# Patient Record
Sex: Male | Born: 1990 | Race: Black or African American | Hispanic: No | Marital: Single | Smoking: Never smoker
Health system: Southern US, Community
[De-identification: ages and names within clinical notes are randomized; demographics above are authoritative.]

## PROBLEM LIST (undated history)

## (undated) DIAGNOSIS — I10 Essential (primary) hypertension: Secondary | ICD-10-CM

---
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
## 2011-02-11 ENCOUNTER — Emergency Department: Payer: Self-pay | Admitting: Emergency Medicine

## 2011-12-01 ENCOUNTER — Emergency Department: Payer: Self-pay | Admitting: Emergency Medicine

## 2017-07-25 ENCOUNTER — Emergency Department
Admission: EM | Admit: 2017-07-25 | Discharge: 2017-07-25 | Disposition: A | Discharge status: Home / Self Care | Payer: Self-pay | Attending: Emergency Medicine | Admitting: Emergency Medicine

## 2017-07-25 ENCOUNTER — Encounter: Payer: Self-pay | Admitting: Emergency Medicine

## 2017-07-25 DIAGNOSIS — R1013 Epigastric pain: Secondary | ICD-10-CM

## 2017-07-25 LAB — COMPREHENSIVE METABOLIC PANEL
ALK PHOS: 69 U/L (ref 38–126)
ALT: 18 U/L (ref 17–63)
AST: 21 U/L (ref 15–41)
Albumin: 4.4 g/dL (ref 3.5–5.0)
Anion gap: 12 (ref 5–15)
BILIRUBIN TOTAL: 1 mg/dL (ref 0.3–1.2)
BUN: 11 mg/dL (ref 6–20)
CALCIUM: 9.6 mg/dL (ref 8.9–10.3)
CHLORIDE: 104 mmol/L (ref 101–111)
CO2: 23 mmol/L (ref 22–32)
CREATININE: 0.9 mg/dL (ref 0.61–1.24)
Glucose, Bld: 121 mg/dL — ABNORMAL HIGH (ref 65–99)
Potassium: 3.8 mmol/L (ref 3.5–5.1)
Sodium: 139 mmol/L (ref 135–145)
Total Protein: 8.7 g/dL — ABNORMAL HIGH (ref 6.5–8.1)

## 2017-07-25 LAB — CBC
HCT: 52.3 % — ABNORMAL HIGH (ref 40.0–52.0)
Hemoglobin: 18 g/dL (ref 13.0–18.0)
MCH: 32.8 pg (ref 26.0–34.0)
MCHC: 34.5 g/dL (ref 32.0–36.0)
MCV: 95 fL (ref 80.0–100.0)
PLATELETS: 383 10*3/uL (ref 150–440)
RBC: 5.5 MIL/uL (ref 4.40–5.90)
RDW: 13.3 % (ref 11.5–14.5)
WBC: 13.1 10*3/uL — AB (ref 3.8–10.6)

## 2017-07-25 LAB — URINALYSIS, COMPLETE (UACMP) WITH MICROSCOPIC
Bilirubin Urine: NEGATIVE
Glucose, UA: NEGATIVE mg/dL
Hgb urine dipstick: NEGATIVE
Ketones, ur: NEGATIVE mg/dL
Nitrite: NEGATIVE
Protein, ur: NEGATIVE mg/dL
Specific Gravity, Urine: 1.01 (ref 1.005–1.030)
pH: 7 (ref 5.0–8.0)

## 2017-07-25 LAB — LIPASE, BLOOD: Lipase: 26 U/L (ref 11–51)

## 2017-07-25 MED ORDER — SUCRALFATE 1 G PO TABS: 1.0000 g | ORAL_TABLET | Freq: Four times a day (QID) | ORAL | 1 refills | Status: AC | PRN

## 2017-07-25 NOTE — ED Provider Notes (Signed)
[facility] Emergency Department Provider Note  ____________________________________________   First MD Initiated Contact with Patient 07/25/17 2118     (approximate)  I have reviewed the triage vital signs and the nursing notes.   HISTORY  Chief Complaint Abdominal Pain    HPI Mike Peterson is a 26 y.o. male who is generally healthy with no chronic medical problems who presents for evaluation of several days of persistent but intermittent sharp and burning epigastric abdominal pain.  Initially told triage that it was on the right side but he indicates to me it is in the epigastrium just below his rib cage.  He states that he does drink quite a bit of alcohol regularly, not to the point of vomiting, but "enough".  his pain started last weekend when he was drinking more.  He states he has not had any for a couple of days but those symptoms have persisted.  Nothing in particular makes the patient's symptoms better nor worse, including eating spicy or greasy food.  He has not had any vomiting and had a little bit of nausea several days ago but that has improved.  The patient denies fever/chills, chest pain, shortness of breath, and dysuria. he describes the pain as moderate at times.  Right now he says it is gone.   History reviewed. No pertinent past medical history.  There are no active problems to display for this patient.   History reviewed. No pertinent surgical history.  Prior to Admission medications   Medication Sig Start Date End Date Taking? Authorizing Provider  sucralfate (CARAFATE) 1 g tablet Take 1 tablet (1 g total) by mouth 4 (four) times daily as needed (for abdominal discomfort, nausea, and/or vomiting). 07/25/17   Loleta Rose, MD    Allergies Patient has no known allergies.  No family history on file.  Social History Social History  Substance Use Topics  . Smoking status: Never Smoker  . Smokeless tobacco: Never Used  . Alcohol use  Yes    Review of Systems Constitutional: No fever/chills Eyes: No visual changes. ENT: No sore throat. Cardiovascular: Denies chest pain. Respiratory: Denies shortness of breath. Gastrointestinal:  Genitourinary: Negative for dysuria. Musculoskeletal: Negative for neck pain.  Negative for back pain. Integumentary: Negative for rash. Neurological: Negative for headaches, focal weakness or numbness.   ____________________________________________   PHYSICAL EXAM:  VITAL SIGNS: ED Triage Vitals  Enc Vitals Group     BP 07/25/17 1950 (!) 165/104     Pulse Rate 07/25/17 1950 72     Resp 07/25/17 1950 18     Temp 07/25/17 1950 98.7 F (37.1 C)     Temp Source 07/25/17 1950 Oral     SpO2 07/25/17 1950 97 %     Weight 07/25/17 1949 77.1 kg (170 lb)     Height 07/25/17 1949 1.727 m ( )     Head Circumference --      Peak Flow --      Pain Score --      Pain Loc --      Pain Edu? --      Excl. in GC? --     Constitutional: Alert and oriented. Well appearing and in no acute distress. Eyes: Conjunctivae are normal.  Head: Atraumatic. Cardiovascular: Normal rate, regular rhythm. Good peripheral circulation. Grossly normal heart sounds. Respiratory: Normal respiratory effort.  No retractions. Lungs CTAB. Gastrointestinal: Soft with minimal tenderness to palpation of the epigastrium.  No rebound/guarding, no RUQ tenderness, negative  murphy's sign, no tenderness at McBurney's Musculoskeletal: No lower extremity tenderness nor edema. No gross deformities of extremities. Neurologic:  Normal speech and language. No gross focal neurologic deficits are appreciated.  Skin:  Skin is warm, dry and intact. No rash noted. Psychiatric: Mood and affect are normal. Speech and behavior are normal.  ____________________________________________   LABS (all labs ordered are listed, but only abnormal results are displayed)  Labs Reviewed  COMPREHENSIVE METABOLIC PANEL - Abnormal; Notable  for the following:       Result Value   Glucose, Bld 121 (*)    Total Protein 8.7 (*)    All other components within normal limits  CBC - Abnormal; Notable for the following:    WBC 13.1 (*)    HCT 52.3 (*)    All other components within normal limits  URINALYSIS, COMPLETE (UACMP) WITH MICROSCOPIC - Abnormal; Notable for the following:    Leukocytes, UA TRACE (*)    Squamous Epithelial / LPF 0-5 (*)    Bacteria, UA RARE (*)    All other components within normal limits  LIPASE, BLOOD   ____________________________________________  EKG  None - EKG not ordered by ED physician ____________________________________________  RADIOLOGY   No results found.  ____________________________________________   PROCEDURES  Critical Care performed: No   Procedure(s) performed:   Procedures   ____________________________________________   INITIAL IMPRESSION / ASSESSMENT AND PLAN / ED COURSE  Pertinent labs & imaging results that were available during my care of the patient were reviewed by me and considered in my medical decision making (see chart for details).  The patient is well-appearing and in no acute distress.  His blood pressure is elevated but this is likely a chronic issue and he has no symptoms as a result of it.  His lab work is unremarkable.  I discussed my recommendation from sticking with a bland diet, avoiding alcohol, and trying some Carafate for the next few days.  I gave him outpatient follow-up information.  I gave my usual and customary return precautions.  He understands and agrees with the plan.  There is no indication for any imaging at this time.      ____________________________________________  FINAL CLINICAL IMPRESSION(S) / ED DIAGNOSES  Final diagnoses:  Epigastric pain     MEDICATIONS GIVEN DURING THIS VISIT:  Medications - No data to display   NEW OUTPATIENT MEDICATIONS STARTED DURING THIS VISIT:  Discharge Medication List as of  07/25/2017  9:26 PM    START taking these medications   Details  sucralfate (CARAFATE) 1 g tablet Take 1 tablet (1 g total) by mouth 4 (four) times daily as needed (for abdominal discomfort, nausea, and/or vomiting)., Starting Wed 07/25/2017, Print        Discharge Medication List as of 07/25/2017  9:26 PM      Discharge Medication List as of 07/25/2017  9:26 PM       Note:  This document was prepared using Dragon voice recognition software and may include unintentional dictation errors.    Loleta RoseForbach, Trentyn Boisclair, MD 07/25/17 2300

## 2017-07-25 NOTE — ED Triage Notes (Addendum)
Patient ambulatory to triage with steady gait, without difficulty or distress noted; pt reports mid right sided abd pain several days with no accomp symptoms; denies hx of same; pt denies any pain at present, st intermittent

## 2017-07-25 NOTE — ED Notes (Signed)
Pt states middle upper abd pain x few days. Denies N&V&D&fever. Denies worse after eating. States pain comes and goes. Still has all belly organs. Alert, oriented, ambulatory.

## 2017-07-25 NOTE — Discharge Instructions (Signed)
You have been seen in the Emergency Department (ED) for abdominal pain.  Your evaluation did not identify a clear cause of your symptoms but was generally reassuring.  Please read through the information about bland diets, and avoid alcohol for at least the next couple of days.  Please follow up as instructed above regarding today?s emergent visit and the symptoms that are bothering you.  Return to the ED if your abdominal pain worsens or fails to improve, you develop bloody vomiting, bloody diarrhea, you are unable to tolerate fluids due to vomiting, fever greater than 101, or other symptoms that concern you.

## 2018-08-01 ENCOUNTER — Other Ambulatory Visit: Payer: Self-pay | Admitting: Podiatry

## 2018-08-01 ENCOUNTER — Encounter
Admission: RE | Admit: 2018-08-01 | Discharge: 2018-08-01 | Disposition: A | Discharge status: Home / Self Care | Payer: Self-pay | Source: Ambulatory Visit | Attending: Podiatry | Admitting: Podiatry

## 2018-08-01 HISTORY — DX: Essential (primary) hypertension: I10

## 2018-08-01 MED ORDER — CEFAZOLIN SODIUM-DEXTROSE 2-4 GM/100ML-% IV SOLN: 2.0000 g | INTRAVENOUS | Status: DC

## 2018-08-01 NOTE — Patient Instructions (Signed)
Your procedure is scheduled on:08/02/18 0730 AM Report to Day Surgery. MEDICAL MALL SECOND FLOOR WILL ENTER ED ENTRANCE   Remember: Instructions that are not followed completely may result in serious medical risk,  up to and including death, or upon the discretion of your surgeon and anesthesiologist your  surgery may need to be rescheduled.     _X__ 1. Do not eat food after midnight the night before your procedure.                 No gum chewing or hard candies. You may drink clear liquids up to 2 hours                 before you are scheduled to arrive for your surgery- DO not drink clear                 liquids within 2 hours of the start of your surgery.                 Clear Liquids include:  water, apple juice without pulp, clear carbohydrate                 drink such as Clearfast of Gatorade, Black Coffee or Tea (Do not add                 anything to coffee or tea).  __X__2.  On the morning of surgery brush your teeth with toothpaste and water, you                may rinse your mouth with mouthwash if you wish.  Do not swallow any toothpaste of mouthwash.     _X__ 3.  No Alcohol for 24 hours before or after surgery.   _X__ 4.  Do Not Smoke or use e-cigarettes For 24 Hours Prior to Your Surgery.                 Do not use any chewable tobacco products for at least 6 hours prior to                 surgery.  ____  5.  Bring all medications with you on the day of surgery if instructed.   X____  6.  Notify your doctor if there is any change in your medical condition      (cold, fever, infections).     Do not wear jewelry, make-up, hairpins, clips or nail polish. Do not wear lotions, powders, or perfumes. You may wear deodorant. Do not shave 48 hours prior to surgery. Men may shave face and neck. Do not bring valuables to the hospital.    [facility] any belongings or valuables.  Contacts, dentures or bridgework may not be worn into  surgery. Leave your suitcase in the car. After surgery it may be brought to your room. For patients admitted to the hospital, discharge time is determined by your treatment team.   Patients discharged the day of surgery will not be allowed to drive home.     ____ Take these medicines the morning of surgery with A SIP OF WATER:    1NONE  2.   3.   4.  5.  6.  ____ Fleet Enema (as directed)   ____ Use CHG Soap as directed  ____ Use inhalers on the day of surgery  ____ Stop metformin 2 days prior to surgery    ____ Take 1/2 of usual insulin dose the  night before surgery. No insulin the morning          of surgery.   ____ Stop Coumadin/Plavix/aspirin on  _X__ Stop Anti-inflammatories on      NO MORE IBUPROFEN UNTIL AFTER SURGERY  ____ Stop supplements until after surgery.    ____ Bring C-Pap to the hospital.   INTERVIEW AND INSTRUCTIONS GIVEN TO SUSAN

## 2018-08-02 ENCOUNTER — Ambulatory Visit: Admitting: Anesthesiology

## 2018-08-02 ENCOUNTER — Ambulatory Visit
Admission: RE | Admit: 2018-08-02 | Discharge: 2018-08-02 | Disposition: A | Discharge status: Home / Self Care | Source: Ambulatory Visit | Attending: Podiatry | Admitting: Podiatry

## 2018-08-02 ENCOUNTER — Other Ambulatory Visit: Payer: Self-pay

## 2018-08-02 ENCOUNTER — Encounter
Admission: RE | Disposition: A | Discharge status: Home / Self Care | Payer: Self-pay | Source: Ambulatory Visit | Attending: Podiatry

## 2018-08-02 ENCOUNTER — Encounter: Payer: Self-pay | Admitting: *Deleted

## 2018-08-02 ENCOUNTER — Ambulatory Visit

## 2018-08-02 DIAGNOSIS — Z419 Encounter for procedure for purposes other than remedying health state, unspecified: Secondary | ICD-10-CM

## 2018-08-02 DIAGNOSIS — F17290 Nicotine dependence, other tobacco product, uncomplicated: Secondary | ICD-10-CM | POA: Insufficient documentation

## 2018-08-02 DIAGNOSIS — S82831A Other fracture of upper and lower end of right fibula, initial encounter for closed fracture: Secondary | ICD-10-CM | POA: Insufficient documentation

## 2018-08-02 DIAGNOSIS — I10 Essential (primary) hypertension: Secondary | ICD-10-CM | POA: Diagnosis not present

## 2018-08-02 DIAGNOSIS — X58XXXA Exposure to other specified factors, initial encounter: Secondary | ICD-10-CM | POA: Insufficient documentation

## 2018-08-02 HISTORY — PX: ORIF ANKLE FRACTURE: SHX5408

## 2018-08-02 LAB — POCT I-STAT 4, (NA,K, GLUC, HGB,HCT)
Glucose, Bld: 99 mg/dL (ref 70–99)
HCT: 49 % (ref 39.0–52.0)
Hemoglobin: 16.7 g/dL (ref 13.0–17.0)
Potassium: 3.9 mmol/L (ref 3.5–5.1)
Sodium: 139 mmol/L (ref 135–145)

## 2018-08-02 SURGERY — OPEN REDUCTION INTERNAL FIXATION (ORIF) ANKLE FRACTURE
Anesthesia: General | Site: Ankle | Laterality: Right

## 2018-08-02 MED ORDER — FAMOTIDINE 20 MG PO TABS: 20.0000 mg | ORAL_TABLET | Freq: Once | ORAL | Status: DC

## 2018-08-02 MED ORDER — POVIDONE-IODINE 7.5 % EX SOLN
Freq: Once | CUTANEOUS | Status: DC
  Filled 2018-08-02: qty 118

## 2018-08-02 MED ORDER — HYDROMORPHONE HCL 1 MG/ML IJ SOLN
INTRAMUSCULAR | Status: AC
  Filled 2018-08-02: qty 1

## 2018-08-02 MED ORDER — PROPOFOL 10 MG/ML IV BOLUS
INTRAVENOUS | Status: AC
  Filled 2018-08-02: qty 20

## 2018-08-02 MED ORDER — ONDANSETRON HCL 4 MG PO TABS: 4.0000 mg | ORAL_TABLET | Freq: Four times a day (QID) | ORAL | Status: DC | PRN

## 2018-08-02 MED ORDER — DEXMEDETOMIDINE HCL 200 MCG/2ML IV SOLN
INTRAVENOUS | Status: DC | PRN
  Administered 2018-08-02: 12 ug via INTRAVENOUS

## 2018-08-02 MED ORDER — MIDAZOLAM HCL 2 MG/2ML IJ SOLN
INTRAMUSCULAR | Status: DC | PRN
  Administered 2018-08-02: 2 mg via INTRAVENOUS

## 2018-08-02 MED ORDER — LIDOCAINE HCL (CARDIAC) PF 100 MG/5ML IV SOSY
PREFILLED_SYRINGE | INTRAVENOUS | Status: DC | PRN
  Administered 2018-08-02: 70 mg via INTRAVENOUS

## 2018-08-02 MED ORDER — MIDAZOLAM HCL 2 MG/2ML IJ SOLN
1.0000 mg | Freq: Once | INTRAMUSCULAR | Status: AC
  Administered 2018-08-02: 1 mg via INTRAVENOUS

## 2018-08-02 MED ORDER — ROPIVACAINE HCL 5 MG/ML IJ SOLN
INTRAMUSCULAR | Status: AC
  Filled 2018-08-02: qty 30

## 2018-08-02 MED ORDER — LIDOCAINE HCL (PF) 1 % IJ SOLN
INTRAMUSCULAR | Status: AC
  Filled 2018-08-02: qty 5

## 2018-08-02 MED ORDER — CEFAZOLIN SODIUM-DEXTROSE 2-4 GM/100ML-% IV SOLN
INTRAVENOUS | Status: AC
  Filled 2018-08-02: qty 100

## 2018-08-02 MED ORDER — DEXAMETHASONE SODIUM PHOSPHATE 10 MG/ML IJ SOLN
INTRAMUSCULAR | Status: DC | PRN
  Administered 2018-08-02: 5 mg via INTRAVENOUS

## 2018-08-02 MED ORDER — FENTANYL CITRATE (PF) 100 MCG/2ML IJ SOLN
INTRAMUSCULAR | Status: AC
  Filled 2018-08-02: qty 2

## 2018-08-02 MED ORDER — ROPIVACAINE HCL 5 MG/ML IJ SOLN
INTRAMUSCULAR | Status: DC | PRN
  Administered 2018-08-02: 20 mL via PERINEURAL
  Administered 2018-08-02: 10 mL via PERINEURAL

## 2018-08-02 MED ORDER — PROPOFOL 10 MG/ML IV BOLUS
INTRAVENOUS | Status: DC | PRN
  Administered 2018-08-02: 200 mg via INTRAVENOUS

## 2018-08-02 MED ORDER — FAMOTIDINE 20 MG PO TABS
ORAL_TABLET | ORAL | Status: AC
  Filled 2018-08-02: qty 1

## 2018-08-02 MED ORDER — ONDANSETRON HCL 4 MG/2ML IJ SOLN
INTRAMUSCULAR | Status: AC
  Filled 2018-08-02: qty 2

## 2018-08-02 MED ORDER — LIDOCAINE HCL (PF) 1 % IJ SOLN
INTRAMUSCULAR | Status: DC | PRN
  Administered 2018-08-02: 1 mL via INTRADERMAL

## 2018-08-02 MED ORDER — LACTATED RINGERS IV SOLN
INTRAVENOUS | Status: DC | PRN
  Administered 2018-08-02: 10:00:00 via INTRAVENOUS

## 2018-08-02 MED ORDER — MIDAZOLAM HCL 2 MG/2ML IJ SOLN
INTRAMUSCULAR | Status: AC
  Administered 2018-08-02: 1 mg via INTRAVENOUS
  Filled 2018-08-02: qty 2

## 2018-08-02 MED ORDER — NEOMYCIN-POLYMYXIN B GU 40-200000 IR SOLN
Status: DC | PRN
  Administered 2018-08-02: 2 mL

## 2018-08-02 MED ORDER — ACETAMINOPHEN 10 MG/ML IV SOLN
INTRAVENOUS | Status: DC | PRN
  Administered 2018-08-02: 1000 mg via INTRAVENOUS

## 2018-08-02 MED ORDER — IBUPROFEN 800 MG PO TABS: 800.0000 mg | ORAL_TABLET | Freq: Three times a day (TID) | ORAL | 0 refills | Status: AC | PRN

## 2018-08-02 MED ORDER — ACETAMINOPHEN 10 MG/ML IV SOLN
INTRAVENOUS | Status: AC
  Filled 2018-08-02: qty 100

## 2018-08-02 MED ORDER — FENTANYL CITRATE (PF) 100 MCG/2ML IJ SOLN: INTRAMUSCULAR | Status: DC | PRN

## 2018-08-02 MED ORDER — HYDROMORPHONE HCL 1 MG/ML IJ SOLN
INTRAMUSCULAR | Status: DC | PRN
  Administered 2018-08-02: 0.5 mg via INTRAVENOUS

## 2018-08-02 MED ORDER — OXYCODONE HCL 5 MG/5ML PO SOLN: 5.0000 mg | Freq: Once | ORAL | Status: DC | PRN

## 2018-08-02 MED ORDER — OXYCODONE HCL 5 MG PO TABS: 5.0000 mg | ORAL_TABLET | Freq: Once | ORAL | Status: DC | PRN

## 2018-08-02 MED ORDER — MIDAZOLAM HCL 2 MG/2ML IJ SOLN
INTRAMUSCULAR | Status: AC
  Filled 2018-08-02: qty 2

## 2018-08-02 MED ORDER — FENTANYL CITRATE (PF) 100 MCG/2ML IJ SOLN: 25.0000 ug | INTRAMUSCULAR | Status: DC | PRN

## 2018-08-02 MED ORDER — FENTANYL CITRATE (PF) 100 MCG/2ML IJ SOLN
INTRAMUSCULAR | Status: AC
  Administered 2018-08-02: 50 ug via INTRAVENOUS
  Filled 2018-08-02: qty 2

## 2018-08-02 MED ORDER — ONDANSETRON HCL 4 MG/2ML IJ SOLN
INTRAMUSCULAR | Status: DC | PRN
  Administered 2018-08-02: 4 mg via INTRAVENOUS

## 2018-08-02 MED ORDER — FENTANYL CITRATE (PF) 100 MCG/2ML IJ SOLN
50.0000 ug | Freq: Once | INTRAMUSCULAR | Status: AC
  Administered 2018-08-02: 50 ug via INTRAVENOUS

## 2018-08-02 MED ORDER — ONDANSETRON HCL 4 MG/2ML IJ SOLN: 4.0000 mg | Freq: Four times a day (QID) | INTRAMUSCULAR | Status: DC | PRN

## 2018-08-02 MED ORDER — BUPIVACAINE HCL (PF) 0.5 % IJ SOLN
INTRAMUSCULAR | Status: DC | PRN
  Administered 2018-08-02: 10 mL

## 2018-08-02 MED ORDER — LACTATED RINGERS IV SOLN
INTRAVENOUS | Status: DC
  Administered 2018-08-02: 08:00:00 via INTRAVENOUS

## 2018-08-02 MED ORDER — ACETAMINOPHEN-CODEINE 300-30 MG PO TABS: 1.0000 | ORAL_TABLET | Freq: Four times a day (QID) | ORAL | 0 refills | Status: AC | PRN

## 2018-08-02 SURGICAL SUPPLY — 46 items
BANDAGE ELASTIC 4 LF NS (GAUZE/BANDAGES/DRESSINGS) ×2 IMPLANT
BIT DRILL 2.5X2.75 QC CALB (BIT) ×2 IMPLANT
BLADE SURG 15 STRL LF DISP TIS (BLADE) IMPLANT
BLADE SURG 15 STRL SS (BLADE)
BNDG COHESIVE 4X5 TAN STRL (GAUZE/BANDAGES/DRESSINGS) ×2 IMPLANT
BNDG CONFORM 3 STRL LF (GAUZE/BANDAGES/DRESSINGS) ×2 IMPLANT
BNDG ESMARK 4X12 TAN STRL LF (GAUZE/BANDAGES/DRESSINGS) ×2 IMPLANT
BNDG GAUZE 4.5X4.1 6PLY STRL (MISCELLANEOUS) ×2 IMPLANT
CANISTER SUCT 1200ML W/VALVE (MISCELLANEOUS) ×2 IMPLANT
CUFF TOURN 30 N/S (MISCELLANEOUS) ×2 IMPLANT
DRAPE C-ARM XRAY 36X54 (DRAPES) ×2 IMPLANT
DRAPE C-ARMOR (DRAPES) ×2 IMPLANT
DURAPREP 26ML APPLICATOR (WOUND CARE) ×2 IMPLANT
ELECT REM PT RETURN 9FT ADLT (ELECTROSURGICAL) ×2
ELECTRODE REM PT RTRN 9FT ADLT (ELECTROSURGICAL) ×1 IMPLANT
GAUZE PETRO XEROFOAM 1X8 (MISCELLANEOUS) ×2 IMPLANT
GAUZE SPONGE 4X4 12PLY STRL (GAUZE/BANDAGES/DRESSINGS) ×2 IMPLANT
GAUZE STRETCH 2X75IN STRL (MISCELLANEOUS) ×2 IMPLANT
GLOVE BIO SURGEON STRL SZ7.5 (GLOVE) ×2 IMPLANT
GLOVE INDICATOR 8.0 STRL GRN (GLOVE) ×2 IMPLANT
GOWN STRL REUS W/ TWL LRG LVL3 (GOWN DISPOSABLE) ×3 IMPLANT
GOWN STRL REUS W/TWL LRG LVL3 (GOWN DISPOSABLE) ×3
KIT TURNOVER KIT A (KITS) ×2 IMPLANT
LABEL OR SOLS (LABEL) ×2 IMPLANT
NS IRRIG 500ML POUR BTL (IV SOLUTION) ×2 IMPLANT
PACK EXTREMITY ARMC (MISCELLANEOUS) ×2 IMPLANT
PAD PREP 24X41 OB/GYN DISP (PERSONAL CARE ITEMS) ×2 IMPLANT
PLATE LOCK 8H 103 BILAT FIB (Plate) ×2 IMPLANT
SCREW LOCK CORT STAR 3.5X10 (Screw) ×2 IMPLANT
SCREW LOCK CORT STAR 3.5X12 (Screw) ×8 IMPLANT
SCREW LOW PROFILE 18MMX3.5MM (Screw) ×2 IMPLANT
SCREW NON LOCKING LP 3.5 16MM (Screw) ×2 IMPLANT
SPLINT CAST 1 STEP 4X30 (MISCELLANEOUS) ×2 IMPLANT
SPLINT FAST PLASTER 5X30 (CAST SUPPLIES) ×10
SPLINT PLASTER CAST FAST 5X30 (CAST SUPPLIES) ×10 IMPLANT
SPONGE LAP 18X18 RF (DISPOSABLE) ×2 IMPLANT
STAPLER SKIN PROX 35W (STAPLE) ×2 IMPLANT
STOCKINETTE M/LG 89821 (MISCELLANEOUS) ×2 IMPLANT
STRAP SAFETY 5IN WIDE (MISCELLANEOUS) ×2 IMPLANT
STRIP CLOSURE SKIN 1/2X4 (GAUZE/BANDAGES/DRESSINGS) IMPLANT
SUT VIC AB 2-0 CT1 27 (SUTURE) ×1
SUT VIC AB 2-0 CT1 TAPERPNT 27 (SUTURE) ×1 IMPLANT
SUT VIC AB 3-0 SH 27 (SUTURE) ×1
SUT VIC AB 3-0 SH 27X BRD (SUTURE) ×1 IMPLANT
SWABSTK COMLB BENZOIN TINCTURE (MISCELLANEOUS) IMPLANT
SYR 10ML LL (SYRINGE) ×2 IMPLANT

## 2018-08-02 NOTE — Anesthesia Post-op Follow-up Note (Signed)
Anesthesia QCDR form completed.        

## 2018-08-02 NOTE — Discharge Instructions (Signed)
[facility] °[facility] ° °POST OPERATIVE INSTRUCTIONS FOR DR. TROXLER AND DR. FOWLER °[facility] ° ° °1. Take your medication as prescribed.  Pain medication should be taken only as needed. ° °2. Keep the dressing clean, dry and intact. ° °3. Keep your foot elevated above the heart level for the first 48 hours. ° °4. Walking to the bathroom and brief periods of walking are acceptable, unless we have instructed you to be non-weight bearing. ° °5. Always use your crutches if you are to be non-weight bearing. ° °6. Do not take a shower. Baths are permissible as long as the foot is kept out of the water.  ° °7. Every hour you are awake:  °- Bend your knee 15 times. ° °8. Call [facility] ([phone]) if any of the following problems occur: °- You develop a temperature or fever. °- The bandage becomes saturated with blood. °- Medication does not stop your pain. °- Injury of the foot occurs. °- Any symptoms of infection including redness, odor, or red streaks running from wound. ° ° ° °AMBULATORY SURGERY  °DISCHARGE INSTRUCTIONS ° ° °1) The drugs that you were given will stay in your system until tomorrow so for the next 24 hours you should not: ° °A) Drive an automobile °B) Make any legal decisions °C) Drink any alcoholic beverage ° ° °2) You may resume regular meals tomorrow.  Today it is better to start with liquids and gradually work up to solid foods. ° °You may eat anything you prefer, but it is better to start with liquids, then soup and crackers, and gradually work up to solid foods. ° ° °3) Please notify your doctor immediately if you have any unusual bleeding, trouble breathing, redness and pain at the surgery site, drainage, fever, or pain not relieved by medication. ° ° ° °4) Additional Instructions: ° ° ° ° ° ° ° °Please contact your physician with any problems or Same Day Surgery at [phone], Monday through Friday 6 am to 4 pm, or [facility] number at [phone]. ° °

## 2018-08-02 NOTE — Anesthesia Procedure Notes (Signed)
Procedure Name: LMA Insertion Date/Time: 08/02/2018 10:15 AM Performed by: Almeta MonasFletcher, Bowie Doiron, CRNA Pre-anesthesia Checklist: Patient identified, Patient being monitored, Timeout performed, Emergency Drugs available and Suction available Patient Re-evaluated:Patient Re-evaluated prior to induction Oxygen Delivery Method: Circle system utilized Preoxygenation: Pre-oxygenation with 100% oxygen Induction Type: IV induction Ventilation: Mask ventilation without difficulty LMA: LMA inserted LMA Size: 4.5 Tube type: Oral Number of attempts: 1 Placement Confirmation: positive ETCO2 and breath sounds checked- equal and bilateral Tube secured with: Tape Dental Injury: Teeth and Oropharynx as per pre-operative assessment

## 2018-08-02 NOTE — Transfer of Care (Signed)
Immediate Anesthesia Transfer of Care Note  Patient: Mike Peterson  Procedure(s) Performed: OPEN REDUCTION INTERNAL FIXATION (ORIF) ANKLE FRACTURE-FIBULAR (Right Ankle)  Patient Location: PACU  Anesthesia Type:General  Level of Consciousness: sedated  Airway & Oxygen Therapy: Patient Spontanous Breathing and Patient connected to face mask oxygen  Post-op Assessment: Report given to RN and Post -op Vital signs reviewed and stable  Post vital signs: Reviewed and stable  Last Vitals:  Vitals Value Taken Time  BP 133/80 08/02/2018 11:29 AM  Temp 36.4 C 08/02/2018 11:29 AM  Pulse 90 08/02/2018 11:40 AM  Resp 15 08/02/2018 11:40 AM  SpO2 99 % 08/02/2018 11:40 AM  Vitals shown include unvalidated device data.  Last Pain:  Vitals:   08/02/18 1129  TempSrc:   PainSc: Asleep      Patients Stated Pain Goal: 0 (08/02/18 0813)  Complications: No apparent anesthesia complications

## 2018-08-02 NOTE — Op Note (Signed)
Operative note   Surgeon:Clary Meeker Armed forces logistics/support/administrative officerowler    Assistant: None    Preop diagnosis: Distal fibula fracture right ankle    Postop diagnosis: Same    Procedure: 1.  ORIF distal fibular fracture 2.  Placement of posterior splint right lower leg    EBL: Minimal    Anesthesia:regional and general patient underwent popliteal block    Hemostasis: Thigh tourniquet inflated to 250 mmHg for 44 minutes    Specimen: None    Complications: None    Operative indications:Ercil S Paolella is an 27 y.o. that presents today for surgical intervention.  The risks/benefits/alternatives/complications have been discussed and consent has been given.    Procedure:  Patient was brought into the OR and placed on the operating table in thesupine position. After anesthesia was obtained theright lower extremity was prepped and draped in usual sterile fashion.  Attention was directed to the lateral aspect of the right ankle where longitudinal incision was performed.  Sharp and blunt dissection carried down to the periosteum.  Subperiosteal dissection was undertaken.  The short oblique fibular fracture was noted.  This was freed.  This was then reduced with bone reduction forceps to an anatomic position.  Next an 8 hole positive plate from the [employer] lower extremity set was placed on the lateral aspect of the ankle.  A combination of locking and nonlocking screws were placed laterally.  Good consolidation and alignment was noted.  No instability was noted of the syndesmosis under live fluoroscopy.  The wound was flushed with copious amounts of irrigation.  The wound was then closed with a combination of 2-0 Vicryl and 3-0 Vicryl with the skin closed with skin staples.  A medial ankle and lateral ankle block was then placed with 0.5% bupivacaine.  Patient was then placed in a well compressive sterile dressing with a posterior splint fabricated to the leg.  He will remain nonweightbearing postoperatively.  He will follow-up in 1  week.  Scription for Tylenol 3 and ibuprofen was provided.    Patient tolerated the procedure and anesthesia well.  Was transported from the OR to the PACU with all vital signs stable and vascular status intact. To be discharged per routine protocol.  Will follow up in approximately 1 week in the outpatient clinic.

## 2018-08-02 NOTE — Anesthesia Preprocedure Evaluation (Signed)
Anesthesia Evaluation  Patient identified by MRN, date of birth, ID band Patient awake    Reviewed: Allergy & Precautions, H&P , NPO status , Patient's Chart, lab work & pertinent test results  History of Anesthesia Complications Negative for: history of anesthetic complications  Airway Mallampati: II  TM Distance: >3 FB Neck ROM: full    Dental  (+) Chipped, Poor Dentition   Pulmonary neg pulmonary ROS, neg shortness of breath,           Cardiovascular Exercise Tolerance: Good hypertension, (-) angina(-) Past MI and (-) DOE      Neuro/Psych negative neurological ROS  negative psych ROS   GI/Hepatic negative GI ROS, Neg liver ROS, neg GERD  ,  Endo/Other  negative endocrine ROS  Renal/GU      Musculoskeletal   Abdominal   Peds  Hematology negative hematology ROS (+)   Anesthesia Other Findings Past Medical History: No date: Hypertension  History reviewed. No pertinent surgical history.  BMI    Body Mass Index:  27.15 kg/m      Reproductive/Obstetrics negative OB ROS                             Anesthesia Physical Anesthesia Plan  ASA: II  Anesthesia Plan: General LMA   Post-op Pain Management: GA combined w/ Regional for post-op pain   Induction: Intravenous  PONV Risk Score and Plan: Ondansetron, Dexamethasone, Midazolam and Treatment may vary due to age or medical condition  Airway Management Planned: LMA  Additional Equipment:   Intra-op Plan:   Post-operative Plan: Extubation in OR  Informed Consent: I have reviewed the patients History and Physical, chart, labs and discussed the procedure including the risks, benefits and alternatives for the proposed anesthesia with the patient or authorized representative who has indicated his/her understanding and acceptance.   Dental Advisory Given  Plan Discussed with: Anesthesiologist, CRNA and Surgeon  Anesthesia Plan  Comments: (Patient consented for risks of anesthesia including but not limited to:  - adverse reactions to medications - damage to teeth, lips or other oral mucosa - sore throat or hoarseness - Damage to heart, brain, lungs or loss of life  Patient voiced understanding.)        Anesthesia Quick Evaluation

## 2018-08-02 NOTE — Anesthesia Procedure Notes (Signed)
Anesthesia Regional Block: Popliteal block   Pre-Anesthetic Checklist: ,, timeout performed, Correct Patient, Correct Site, Correct Laterality, Correct Procedure, Correct Position, site marked, Risks and benefits discussed,  Surgical consent,  Pre-op evaluation,  At surgeon's request and post-op pain management  Laterality: Lower and Right  Prep: chloraprep       Needles:  Injection technique: Single-shot  Needle Type: Echogenic Needle     Needle Length: 9cm  Needle Gauge: 21     Additional Needles:   Procedures:,,,, ultrasound used (permanent image in chart),,,,  Narrative:  Start time: 08/02/2018 8:35 AM End time: 08/02/2018 8:40 AM Injection made incrementally with aspirations every 5 mL.  Performed by: Personally  Anesthesiologist: Zoey Bidwell, Cleda MccreedyJoseph K, MD  Additional Notes: Functioning IV was confirmed and monitors were applied.  A echogenic needle was used. Sterile prep,hand hygiene and sterile gloves were used. Minimal sedation used for procedure.   No paresthesia endorsed by patient during the procedure.  Negative aspiration and negative test dose prior to incremental administration of local anesthetic. The patient tolerated the procedure well with no immediate complications.

## 2018-08-02 NOTE — Anesthesia Postprocedure Evaluation (Signed)
Anesthesia Post Note  Patient: Mike Peterson  Procedure(s) Performed: OPEN REDUCTION INTERNAL FIXATION (ORIF) ANKLE FRACTURE-FIBULAR (Right Ankle)  Patient location during evaluation: PACU Anesthesia Type: General Level of consciousness: awake and alert Pain management: pain level controlled Vital Signs Assessment: post-procedure vital signs reviewed and stable Respiratory status: spontaneous breathing, nonlabored ventilation, respiratory function stable and patient connected to nasal cannula oxygen Cardiovascular status: blood pressure returned to baseline and stable Postop Assessment: no apparent nausea or vomiting Anesthetic complications: no     Last Vitals:  Vitals:   08/02/18 1237 08/02/18 1326  BP: (!) 125/92 (!) 151/97  Pulse: 76 84  Resp: 18 18  Temp: 36.7 C   SpO2: 100% 100%    Last Pain:  Vitals:   08/02/18 1326  TempSrc:   PainSc: 0-No pain                 Cleda MccreedyJoseph K Julius Boniface

## 2018-08-05 ENCOUNTER — Encounter: Payer: Self-pay | Admitting: Podiatry

## 2019-11-11 IMAGING — RF DG ANKLE 2V *R*
1 series · 4 of 4 positions shown · non-contrast
Comparison: None.

CLINICAL DATA: Distal fibula ORIF.

EXAM:
DG C-ARM 61-120 MIN; RIGHT ANKLE - 2 VIEW

[Series 1: run · 4 of 4 slices shown]
[im 1/4]
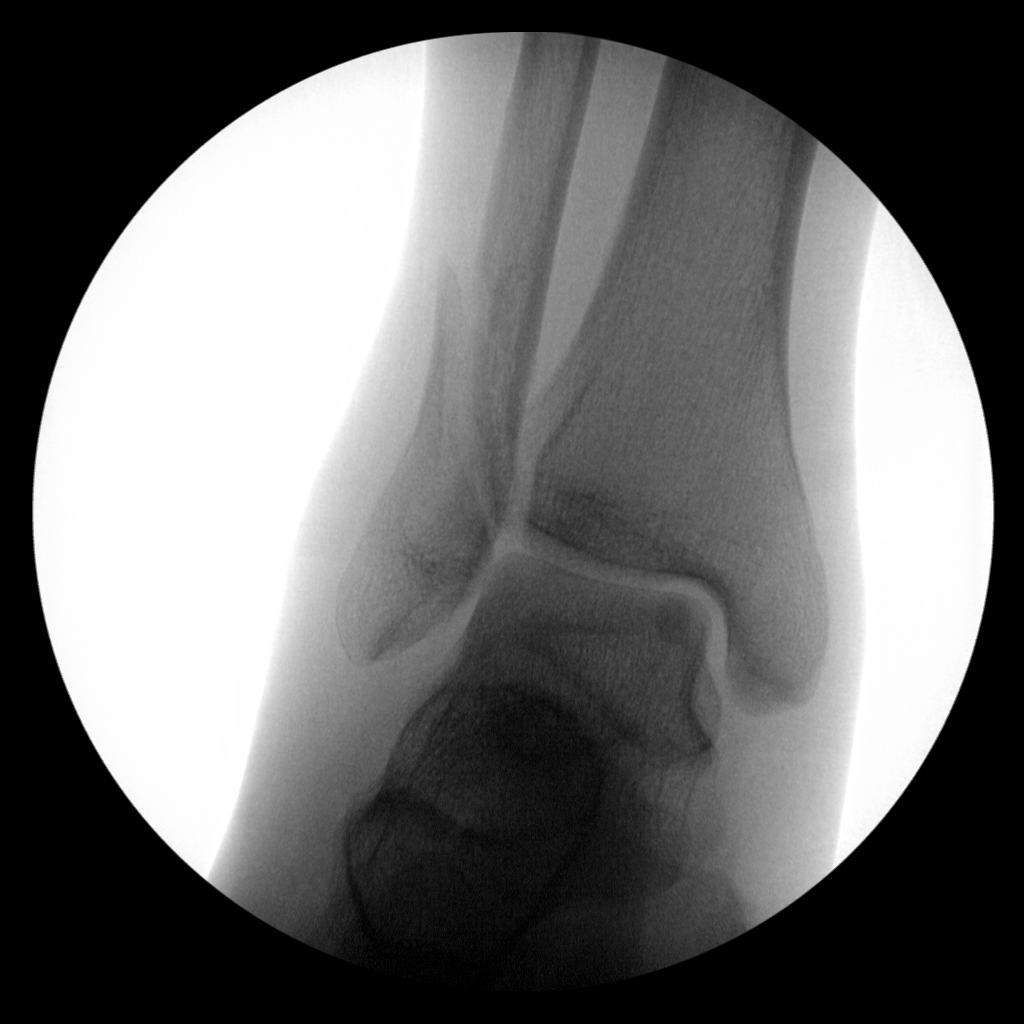
[im 2/4]
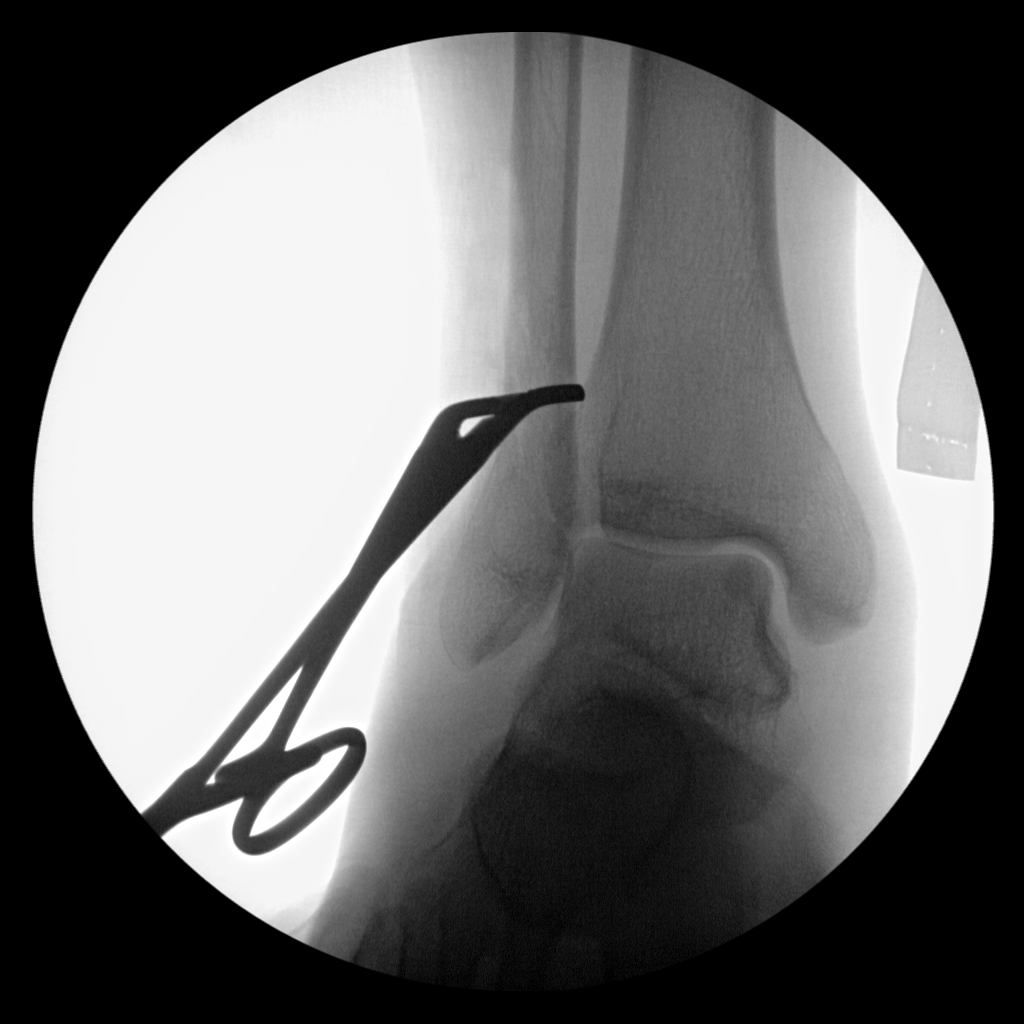
[im 3/4]
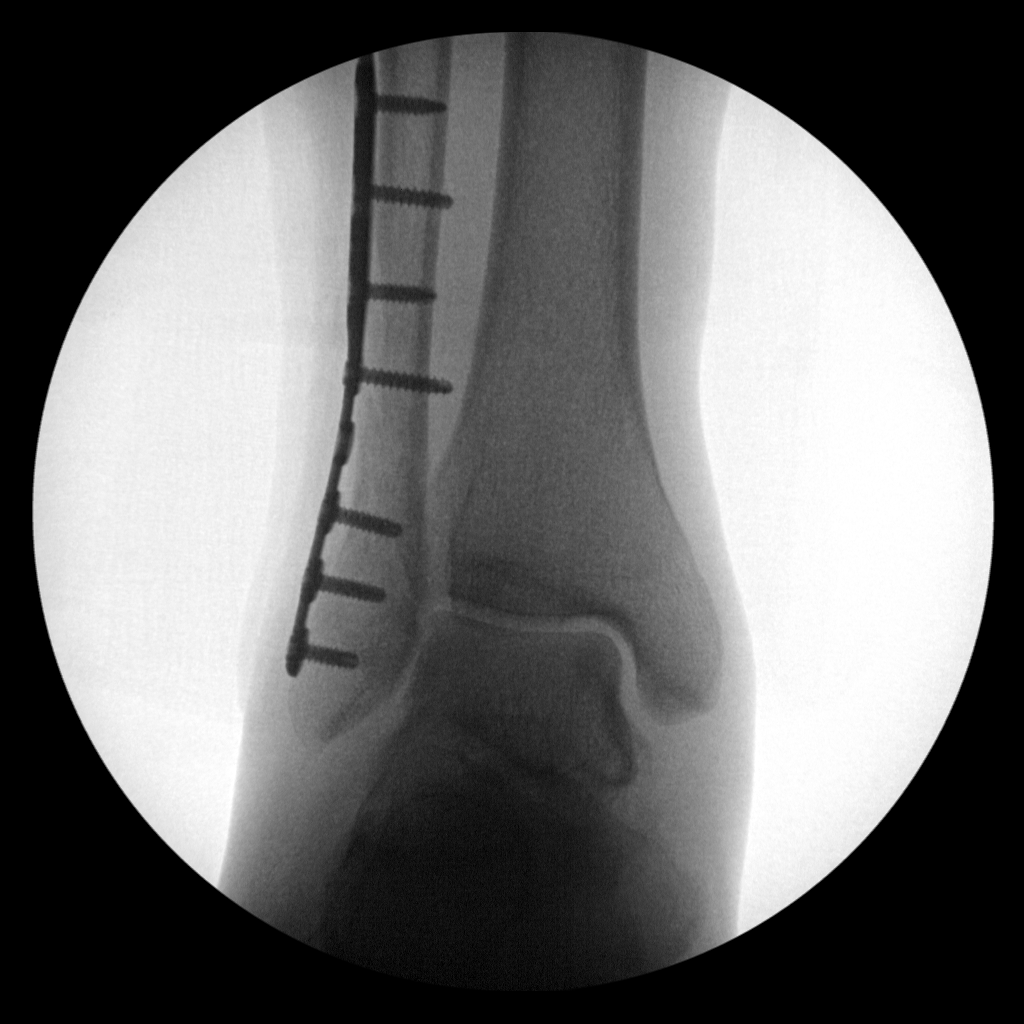
[im 4/4]
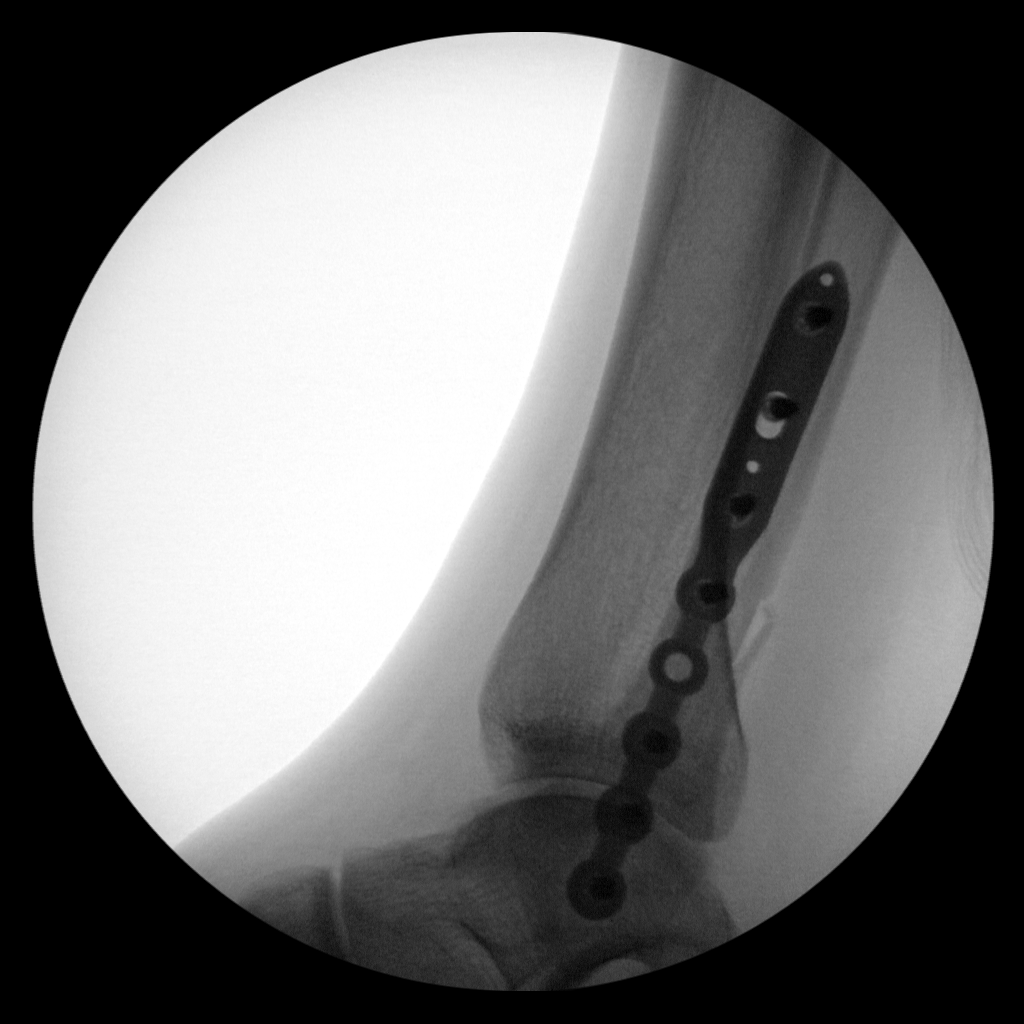

[4 of 4 positions shown; findings below may reference images not displayed]

FLUOROSCOPY TIME:  33.9 seconds

C-arm fluoroscopic images were obtained intraoperatively and
submitted for post operative interpretation.
FINDINGS: Intraoperative fluoroscopic images demonstrate interval lateral
plate and screw fixation of the distal fibular fracture, now in
anatomic alignment. The ankle mortise is symmetric. The talar dome
is intact.
IMPRESSION: Intraoperative fluoroscopic guidance for distal fibula ORIF.
